# Patient Record
Sex: Male | Born: 1995 | Race: White | Hispanic: No | Marital: Single | Smoking: Never smoker
Health system: Southern US, Community
[De-identification: ages and names within clinical notes are randomized; demographics above are authoritative.]

## PROBLEM LIST (undated history)

## (undated) DIAGNOSIS — F909 Attention-deficit hyperactivity disorder, unspecified type: Secondary | ICD-10-CM

## (undated) DIAGNOSIS — C8590 Non-Hodgkin lymphoma, unspecified, unspecified site: Secondary | ICD-10-CM

## (undated) DIAGNOSIS — R51 Headache: Secondary | ICD-10-CM

---
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
